# Patient Record
Sex: Female | Born: 1990 | Race: White | Hispanic: No | Marital: Single | Smoking: Never smoker
Health system: Southern US, Community
[De-identification: ages and names within clinical notes are randomized; demographics above are authoritative.]

## PROBLEM LIST (undated history)

## (undated) HISTORY — PX: TONSILLECTOMY: SUR1361

---

## 2012-05-01 ENCOUNTER — Emergency Department (HOSPITAL_COMMUNITY): Payer: Worker's Compensation

## 2012-05-01 ENCOUNTER — Encounter (HOSPITAL_COMMUNITY): Payer: Self-pay | Admitting: *Deleted

## 2012-05-01 ENCOUNTER — Emergency Department (HOSPITAL_COMMUNITY)
Admission: EM | Admit: 2012-05-01 | Discharge: 2012-05-01 | Disposition: A | Discharge status: Home / Self Care | Payer: Worker's Compensation | Attending: Emergency Medicine | Admitting: Emergency Medicine

## 2012-05-01 DIAGNOSIS — W19XXXA Unspecified fall, initial encounter: Secondary | ICD-10-CM | POA: Insufficient documentation

## 2012-05-01 DIAGNOSIS — T23029A Burn of unspecified degree of unspecified single finger (nail) except thumb, initial encounter: Secondary | ICD-10-CM | POA: Insufficient documentation

## 2012-05-01 DIAGNOSIS — T23059A Burn of unspecified degree of unspecified palm, initial encounter: Secondary | ICD-10-CM | POA: Insufficient documentation

## 2012-05-01 DIAGNOSIS — T310 Burns involving less than 10% of body surface: Secondary | ICD-10-CM | POA: Insufficient documentation

## 2012-05-01 DIAGNOSIS — Y9269 Other specified industrial and construction area as the place of occurrence of the external cause: Secondary | ICD-10-CM | POA: Insufficient documentation

## 2012-05-01 DIAGNOSIS — W860XXA Exposure to domestic wiring and appliances, initial encounter: Secondary | ICD-10-CM | POA: Insufficient documentation

## 2012-05-01 MED ORDER — TRAMADOL HCL 50 MG PO TABS: 50.0000 mg | ORAL_TABLET | Freq: Four times a day (QID) | ORAL | Status: AC | PRN

## 2012-05-01 MED ORDER — SILVER SULFADIAZINE 1 % EX CREA
TOPICAL_CREAM | Freq: Once | CUTANEOUS | Status: AC
  Administered 2012-05-01: 11:00:00 via TOPICAL
  Filled 2012-05-01: qty 50

## 2012-05-01 NOTE — ED Notes (Signed)
Pt rt index finger is swollen and black. Skin looks to be intact. Pt states she is having a burning pain and shooting pain that radiate up her arm.

## 2012-05-01 NOTE — Discharge Instructions (Signed)
Follow up with dr. Hilda Lias at [facility] tomorrow
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
## 2012-10-22 ENCOUNTER — Encounter (HOSPITAL_COMMUNITY): Payer: Self-pay

## 2012-10-22 ENCOUNTER — Emergency Department (HOSPITAL_COMMUNITY)
Admission: EM | Admit: 2012-10-22 | Discharge: 2012-10-22 | Disposition: A | Discharge status: Home / Self Care | Payer: Self-pay | Attending: Emergency Medicine | Admitting: Emergency Medicine

## 2012-10-22 ENCOUNTER — Emergency Department (HOSPITAL_COMMUNITY): Payer: Self-pay

## 2012-10-22 DIAGNOSIS — Y929 Unspecified place or not applicable: Secondary | ICD-10-CM | POA: Insufficient documentation

## 2012-10-22 DIAGNOSIS — Y9389 Activity, other specified: Secondary | ICD-10-CM | POA: Insufficient documentation

## 2012-10-22 DIAGNOSIS — S6390XA Sprain of unspecified part of unspecified wrist and hand, initial encounter: Secondary | ICD-10-CM | POA: Insufficient documentation

## 2012-10-22 DIAGNOSIS — S63601A Unspecified sprain of right thumb, initial encounter: Secondary | ICD-10-CM

## 2012-10-22 DIAGNOSIS — IMO0002 Reserved for concepts with insufficient information to code with codable children: Secondary | ICD-10-CM | POA: Insufficient documentation

## 2012-10-22 NOTE — ED Provider Notes (Signed)
History     CSN: [id number]  Arrival date & time 10/22/12  1136   First MD Initiated Contact with Patient 10/22/12 1226      Chief Complaint  Patient presents with  . Finger Injury    (Consider location/radiation/quality/duration/timing/severity/associated sxs/prior treatment) HPI Comments: Playing with her dog last PM and "jammed" her R thumb.  R hand dominant.    The history is provided by the patient. No language interpreter was used.    History reviewed. No pertinent past medical history.  Past Surgical History  Procedure Date  . Tonsillectomy     No family history on file.  History  Substance Use Topics  . Smoking status: Never Smoker   . Smokeless tobacco: Not on file  . Alcohol Use: No    OB History    Grav Para Term Preterm Abortions TAB SAB Ect Mult Living                  Review of Systems  Musculoskeletal: Positive for joint swelling.       Thumb injury   Skin: Negative for wound.  All other systems reviewed and are negative.    Allergies  Review of patient's allergies indicates no known allergies.  Home Medications   Current Outpatient Rx  Name  Route  Sig  Dispense  Refill  . IBUPROFEN 200 MG PO TABS   Oral   Take 200 mg by mouth every 6 (six) hours as needed. Pain           BP 131/71  Pulse 93  Temp 97.7 F (36.5 C) (Oral)  Resp 20  Ht 5\' 7"  (1.702 m)  Wt 220 lb (99.791 kg)  BMI 34.46 kg/m2  SpO2 99%  LMP 10/08/2012  Physical Exam  Nursing note and vitals reviewed. Constitutional: She is oriented to person, place, and time. She appears well-developed and well-nourished. No distress.  HENT:  Head: Normocephalic and atraumatic.  Eyes: EOM are normal.  Neck: Normal range of motion.  Cardiovascular: Normal rate, regular rhythm and normal heart sounds.   Pulmonary/Chest: Effort normal and breath sounds normal.  Abdominal: Soft. She exhibits no distension. There is no tenderness.  Musculoskeletal: She exhibits tenderness.        Right hand: She exhibits decreased range of motion, tenderness, bony tenderness and swelling. She exhibits normal capillary refill, no deformity and no laceration. normal sensation noted. Normal strength noted.       Hands: Neurological: She is alert and oriented to person, place, and time.  Skin: Skin is warm and dry.  Psychiatric: She has a normal mood and affect. Judgment normal.    ED Course  Procedures (including critical care time)  Labs Reviewed - No data to display Dg Finger Thumb Right  10/22/2012  *RADIOLOGY REPORT*  Clinical Data: Pain post trauma  RIGHT THUMB 2+V  Comparison: None.  Findings:  Frontal, oblique, and lateral views were obtained.  No fracture or dislocation.  Joint spaces appear intact.  No erosive change.  No radiopaque foreign body.    IMPRESSION: No abnormality noted radiographically.   Original Report Authenticated By: Bretta Bang, M.D.      1. Sprain of right thumb       MDM  No fxs  Ice, elevation, ibuprofen Thumb spica splint.   F/u with dr. Hilda Lias prn        Evalina Field, PA 10/22/12 1249

## 2012-10-22 NOTE — ED Notes (Signed)
Rt thumb injury yesterday when playing with her dog.  Swelling present.  Alert NAD

## 2012-10-22 NOTE — ED Notes (Signed)
Pt reports injuring her rt thumb while playing with her dog last night.  Pt has swelling to the area and severe pain upon movement.

## 2012-10-23 NOTE — ED Provider Notes (Signed)
Medical screening examination/treatment/procedure(s) were performed by non-physician practitioner and as supervising physician I was immediately available for consultation/collaboration.   Laray Anger, DO 10/23/12 1825

## 2013-12-02 ENCOUNTER — Emergency Department (HOSPITAL_COMMUNITY): Payer: Self-pay

## 2013-12-02 ENCOUNTER — Encounter (HOSPITAL_COMMUNITY): Payer: Self-pay | Admitting: Emergency Medicine

## 2013-12-02 ENCOUNTER — Emergency Department (HOSPITAL_COMMUNITY)
Admission: EM | Admit: 2013-12-02 | Discharge: 2013-12-02 | Disposition: A | Discharge status: Home / Self Care | Payer: Self-pay | Attending: Emergency Medicine | Admitting: Emergency Medicine

## 2013-12-02 DIAGNOSIS — K2900 Acute gastritis without bleeding: Secondary | ICD-10-CM | POA: Insufficient documentation

## 2013-12-02 DIAGNOSIS — R002 Palpitations: Secondary | ICD-10-CM | POA: Insufficient documentation

## 2013-12-02 DIAGNOSIS — R1013 Epigastric pain: Secondary | ICD-10-CM

## 2013-12-02 LAB — CBC WITH DIFFERENTIAL/PLATELET
BASOS ABS: 0 10*3/uL (ref 0.0–0.1)
Basophils Relative: 0 % (ref 0–1)
EOS PCT: 3 % (ref 0–5)
Eosinophils Absolute: 0.3 10*3/uL (ref 0.0–0.7)
HEMATOCRIT: 39 % (ref 36.0–46.0)
HEMOGLOBIN: 13.5 g/dL (ref 12.0–15.0)
LYMPHS ABS: 1.9 10*3/uL (ref 0.7–4.0)
LYMPHS PCT: 20 % (ref 12–46)
MCH: 29.7 pg (ref 26.0–34.0)
MCHC: 34.6 g/dL (ref 30.0–36.0)
MCV: 85.9 fL (ref 78.0–100.0)
MONO ABS: 0.7 10*3/uL (ref 0.1–1.0)
MONOS PCT: 8 % (ref 3–12)
NEUTROS ABS: 6.3 10*3/uL (ref 1.7–7.7)
Neutrophils Relative %: 69 % (ref 43–77)
Platelets: 349 10*3/uL (ref 150–400)
RBC: 4.54 MIL/uL (ref 3.87–5.11)
RDW: 12.9 % (ref 11.5–15.5)
WBC: 9.2 10*3/uL (ref 4.0–10.5)

## 2013-12-02 LAB — URINALYSIS, ROUTINE W REFLEX MICROSCOPIC
Bilirubin Urine: NEGATIVE
GLUCOSE, UA: NEGATIVE mg/dL
Hgb urine dipstick: NEGATIVE
KETONES UR: NEGATIVE mg/dL
Nitrite: NEGATIVE
PROTEIN: NEGATIVE mg/dL
Specific Gravity, Urine: 1.02 (ref 1.005–1.030)
UROBILINOGEN UA: 0.2 mg/dL (ref 0.0–1.0)
pH: 6 (ref 5.0–8.0)

## 2013-12-02 LAB — COMPREHENSIVE METABOLIC PANEL
ALT: 19 U/L (ref 0–35)
AST: 13 U/L (ref 0–37)
Albumin: 3.9 g/dL (ref 3.5–5.2)
Alkaline Phosphatase: 82 U/L (ref 39–117)
BILIRUBIN TOTAL: 0.5 mg/dL (ref 0.3–1.2)
BUN: 11 mg/dL (ref 6–23)
CHLORIDE: 102 meq/L (ref 96–112)
CO2: 25 meq/L (ref 19–32)
CREATININE: 0.71 mg/dL (ref 0.50–1.10)
Calcium: 9.4 mg/dL (ref 8.4–10.5)
GLUCOSE: 97 mg/dL (ref 70–99)
Potassium: 3.8 mEq/L (ref 3.7–5.3)
Sodium: 139 mEq/L (ref 137–147)
Total Protein: 7.6 g/dL (ref 6.0–8.3)

## 2013-12-02 LAB — LIPASE, BLOOD: Lipase: 23 U/L (ref 11–59)

## 2013-12-02 LAB — URINE MICROSCOPIC-ADD ON

## 2013-12-02 MED ORDER — ONDANSETRON HCL 4 MG PO TABS: 4.0000 mg | ORAL_TABLET | Freq: Four times a day (QID) | ORAL | Status: AC

## 2013-12-02 MED ORDER — DIPHENHYDRAMINE HCL 50 MG/ML IJ SOLN
25.0000 mg | Freq: Once | INTRAMUSCULAR | Status: AC
  Administered 2013-12-02: 25 mg via INTRAVENOUS
  Filled 2013-12-02: qty 1

## 2013-12-02 MED ORDER — SODIUM CHLORIDE 0.9 % IV SOLN: 1000.0000 mL | INTRAVENOUS | Status: DC

## 2013-12-02 MED ORDER — SODIUM CHLORIDE 0.9 % IV SOLN
1000.0000 mL | Freq: Once | INTRAVENOUS | Status: AC
  Administered 2013-12-02: 1000 mL via INTRAVENOUS

## 2013-12-02 MED ORDER — METOCLOPRAMIDE HCL 5 MG/ML IJ SOLN
10.0000 mg | Freq: Once | INTRAMUSCULAR | Status: AC
  Administered 2013-12-02: 10 mg via INTRAVENOUS
  Filled 2013-12-02: qty 2

## 2013-12-02 MED ORDER — FAMOTIDINE IN NACL 20-0.9 MG/50ML-% IV SOLN
20.0000 mg | Freq: Once | INTRAVENOUS | Status: AC
  Administered 2013-12-02: 20 mg via INTRAVENOUS
  Filled 2013-12-02: qty 50

## 2013-12-02 MED ORDER — FENTANYL CITRATE 0.05 MG/ML IJ SOLN
50.0000 ug | INTRAMUSCULAR | Status: DC | PRN
  Administered 2013-12-02: 50 ug via INTRAVENOUS
  Filled 2013-12-02 (×2): qty 2

## 2013-12-02 MED ORDER — OMEPRAZOLE 20 MG PO CPDR: DELAYED_RELEASE_CAPSULE | ORAL | Status: AC

## 2013-12-02 NOTE — ED Notes (Signed)
Ultrasound being done at bedside.

## 2013-12-02 NOTE — Discharge Instructions (Signed)
Avoid fried, spicy or greasy foods. Avoid alcohol, aspirin products, ibuprofen, motrin, aleve, advil, alka seltzer, all of which can make your abdominal pain worse. Take the prilosec OTC as described. Use the zofran for nausea or vomiting. You can be rechecked by Dr Jena Gaussourk, the Gastroenterologist on call if your symptoms persist. Call his office to get an appointment.     Gastritis, Adult Gastritis is soreness and swelling (inflammation) of the lining of the stomach. Gastritis can develop as a sudden onset (acute) or long-term (chronic) condition. If gastritis is not treated, it can lead to stomach bleeding and ulcers. CAUSES  Gastritis occurs when the stomach lining is weak or damaged. Digestive juices from the stomach then inflame the weakened stomach lining. The stomach lining may be weak or damaged due to viral or bacterial infections. One common bacterial infection is the Helicobacter pylori infection. Gastritis can also result from excessive alcohol consumption, taking certain medicines, or having too much acid in the stomach.  SYMPTOMS  In some cases, there are no symptoms. When symptoms are present, they may include:  Pain or a burning sensation in the upper abdomen.  Nausea.  Vomiting.  An uncomfortable feeling of fullness after eating. DIAGNOSIS  Your caregiver may suspect you have gastritis based on your symptoms and a physical exam. To determine the cause of your gastritis, your caregiver may perform the following:  Blood or stool tests to check for the H pylori bacterium.  Gastroscopy. A thin, flexible tube (endoscope) is passed down the esophagus and into the stomach. The endoscope has a light and camera on the end. Your caregiver uses the endoscope to view the inside of the stomach.  Taking a tissue sample (biopsy) from the stomach to examine under a microscope. TREATMENT  Depending on the cause of your gastritis, medicines may be prescribed. If you have a bacterial  infection, such as an H pylori infection, antibiotics may be given. If your gastritis is caused by too much acid in the stomach, H2 blockers or antacids may be given. Your caregiver may recommend that you stop taking aspirin, ibuprofen, or other nonsteroidal anti-inflammatory drugs (NSAIDs). HOME CARE INSTRUCTIONS  Only take over-the-counter or prescription medicines as directed by your caregiver.  If you were given antibiotic medicines, take them as directed. Finish them even if you start to feel better.  Drink enough fluids to keep your urine clear or pale yellow.  Avoid foods and drinks that make your symptoms worse, such as:  Caffeine or alcoholic drinks.  Chocolate.  Peppermint or mint flavorings.  Garlic and onions.  Spicy foods.  Citrus fruits, such as oranges, lemons, or limes.  Tomato-based foods such as sauce, chili, salsa, and pizza.  Fried and fatty foods.  Eat small, frequent meals instead of large meals. SEEK IMMEDIATE MEDICAL CARE IF:   You have black or dark red stools.  You vomit blood or material that looks like coffee grounds.  You are unable to keep fluids down.  Your abdominal pain gets worse.  You have a fever.  You do not feel better after 1 week.  You have any other questions or concerns. MAKE SURE YOU:  Understand these instructions.  Will watch your condition.  Will get help right away if you are not doing well or get worse. Document Released: 10/18/2001 Document Revised: 04/24/2012 Document Reviewed: 12/07/2011 [facility] LPExit[facility] Patient Information 2014 [place], [place].

## 2013-12-02 NOTE — ED Provider Notes (Signed)
CSN: [id number]     Arrival date & time 12/02/13  4098 History  This chart was scribed for Ward Givens, MD by Dorothey Baseman, ED Scribe. This patient was seen in room APA19/APA19 and the patient's care was started at 7:37 AM.    Chief Complaint  Patient presents with  . Abdominal Pain   The history is provided by the patient. No language interpreter was used.   HPI Comments: Yvonne Haley is a 23 y.o. female who presents to the Emergency Department complaining of a constant, sharp pain to the epigastric region of the abdomen onset 3 days ago that began progressively worsening last night, around 8.5 hours ago. She states that last night was the last time she had anything to eat which was pizza about 7:30 pm and had a few sips of soda this morning. She states that the pain will intermittently shoot into her mid-back, more around the left scapula. She states that the pain is exacerbated with movement and eating. She states that the pain is alleviated with laying back. Patient reports associated abdominal bloating, nausea, multiple episodes (approximately 5-6 last night and again this morning) of emesis, and 1 episode of diarrhea. Patient reports some ntermittent palpitations, described as a "fluttering" or "skipping a beat" that she expresses may be related to stress. She denies fever, dysuria, urinary frequency. She denies burning pain or discomfort. She c/o bloating. She denies history of abdominal surgeries. She reports a familial history (mother) of cholelithiasis. Patient has no other pertinent medical history. Patient does not drink or smoke. Patient states she does not currently have a PCP.   PCP none  History reviewed. No pertinent past medical history. Past Surgical History  Procedure Laterality Date  . Tonsillectomy     No family history on file. History  Substance Use Topics  . Smoking status: Never Smoker   . Smokeless tobacco: Not on file  . Alcohol Use: No   Employed at  [employer]   OB History   Grav Para Term Preterm Abortions TAB SAB Ect Mult Living                 Review of Systems  Constitutional: Negative for fever.  Cardiovascular: Positive for palpitations.  Gastrointestinal: Positive for nausea, vomiting, abdominal pain and diarrhea. Negative for abdominal distention.  Genitourinary: Negative for dysuria and urgency.    Allergies  Review of patient's allergies indicates no known allergies.  Home Medications   Current Outpatient Rx  Name  Route  Sig  Dispense  Refill  . ibuprofen (ADVIL,MOTRIN) 200 MG tablet   Oral   Take 200 mg by mouth every 6 (six) hours as needed. Pain          Triage Vitals: BP 142/68  Pulse 79  Temp(Src) 97.6 F (36.4 C)  Resp 18  Ht 5\' 7"  (1.702 m)  Wt 245 lb (111.131 kg)  BMI 38.36 kg/m2  SpO2 100%  LMP 11/04/2013  Vital signs normal    Physical Exam  Nursing note and vitals reviewed. Constitutional: She is oriented to person, place, and time. She appears well-developed and well-nourished.  Non-toxic appearance. She does not appear ill. No distress.  HENT:  Head: Normocephalic and atraumatic.  Right Ear: External ear normal.  Left Ear: External ear normal.  Nose: Nose normal. No mucosal edema or rhinorrhea.  Mouth/Throat: Mucous membranes are normal. No dental abscesses or uvula swelling. No oropharyngeal exudate.  Mucus membranes somewhat dry.   Eyes: Conjunctivae and EOM  are normal. Pupils are equal, round, and reactive to light.  Neck: Normal range of motion and full passive range of motion without pain. Neck supple.  Cardiovascular: Normal rate, regular rhythm and normal heart sounds.  Exam reveals no gallop and no friction rub.   No murmur heard. Pulmonary/Chest: Effort normal and breath sounds normal. No respiratory distress. She has no wheezes. She has no rhonchi. She has no rales. She exhibits no tenderness and no crepitus.  Abdominal: Soft. Normal appearance. She exhibits no distension.  There is tenderness. There is no rebound and no guarding.    Diminished bowel sounds. Most tenderness to palpation to the epigastric region, but diffusely in the upper abdomen.   Musculoskeletal: Normal range of motion. She exhibits no edema and no tenderness.  Moves all extremities well.   Neurological: She is alert and oriented to person, place, and time. She has normal strength. No cranial nerve deficit.  Skin: Skin is warm, dry and intact. No rash noted. No erythema. No pallor.  Psychiatric: She has a normal mood and affect. Her speech is normal and behavior is normal. Her mood appears not anxious.    ED Course  Procedures (including critical care time)  Medications  0.9 %  sodium chloride infusion (0 mLs Intravenous Stopped 12/02/13 0919)    Followed by  0.9 %  sodium chloride infusion (1,000 mLs Intravenous New Bag/Given 12/02/13 0920)    Followed by  0.9 %  sodium chloride infusion (not administered)  fentaNYL (SUBLIMAZE) injection 50 mcg (50 mcg Intravenous Given 12/02/13 0947)  metoCLOPramide (REGLAN) injection 10 mg (10 mg Intravenous Given 12/02/13 0804)  diphenhydrAMINE (BENADRYL) injection 25 mg (25 mg Intravenous Given 12/02/13 0804)  famotidine (PEPCID) IVPB 20 mg (0 mg Intravenous Stopped 12/02/13 1021)   DIAGNOSTIC STUDIES: Oxygen Saturation is 100% on room air, normal by my interpretation.    COORDINATION OF CARE: 7:44 AM- Discussed that symptoms are likely related to her gallbladder. Will order blood labs and UA. Will order IV fluids, Reglan, Benadryl, and Sublimaze to manage symptoms. Discussed treatment plan with patient at bedside and patient verbalized agreement.   8:33 AM- Patient reports feeling much better after receiving the medications and states that her pain is almost gone. Discussed that lab results were normal. Will order an ultrasound to rule out cholelithiasis. Discussed treatment plan with patient at bedside and patient verbalized agreement.   9:48 AM-  Discussed that ultrasound results were negative for cholelithiasis. Will discharge patient with Pepcid. Discussed treatment plan with patient at bedside and patient verbalized agreement.   10:50 AM- Patient reports feeling much better. Will order a GI cocktail. Advised patient to follow up with the referred GI specialist as needed. Will discharge patient with Prilosec and Zofran. Discussed treatment plan with patient at bedside and patient verbalized agreement.    Results for orders placed during the hospital encounter of 12/02/13  CBC WITH DIFFERENTIAL      Result Value Range   WBC 9.2  4.0 - 10.5 K/uL   RBC 4.54  3.87 - 5.11 MIL/uL   Hemoglobin 13.5  12.0 - 15.0 g/dL   HCT 16.1  09.6 - 04.5 %   MCV 85.9  78.0 - 100.0 fL   MCH 29.7  26.0 - 34.0 pg   MCHC 34.6  30.0 - 36.0 g/dL   RDW 40.9  81.1 - 91.4 %   Platelets 349  150 - 400 K/uL   Neutrophils Relative % 69  43 - 77 %  Neutro Abs 6.3  1.7 - 7.7 K/uL   Lymphocytes Relative 20  12 - 46 %   Lymphs Abs 1.9  0.7 - 4.0 K/uL   Monocytes Relative 8  3 - 12 %   Monocytes Absolute 0.7  0.1 - 1.0 K/uL   Eosinophils Relative 3  0 - 5 %   Eosinophils Absolute 0.3  0.0 - 0.7 K/uL   Basophils Relative 0  0 - 1 %   Basophils Absolute 0.0  0.0 - 0.1 K/uL  COMPREHENSIVE METABOLIC PANEL      Result Value Range   Sodium 139  137 - 147 mEq/L   Potassium 3.8  3.7 - 5.3 mEq/L   Chloride 102  96 - 112 mEq/L   CO2 25  19 - 32 mEq/L   Glucose, Bld 97  70 - 99 mg/dL   BUN 11  6 - 23 mg/dL   Creatinine, Ser 1.61  0.50 - 1.10 mg/dL   Calcium 9.4  8.4 - 09.6 mg/dL   Total Protein 7.6  6.0 - 8.3 g/dL   Albumin 3.9  3.5 - 5.2 g/dL   AST 13  0 - 37 U/L   ALT 19  0 - 35 U/L   Alkaline Phosphatase 82  39 - 117 U/L   Total Bilirubin 0.5  0.3 - 1.2 mg/dL   GFR calc non Af Amer >90  >90 mL/min   GFR calc Af Amer >90  >90 mL/min  LIPASE, BLOOD      Result Value Range   Lipase 23  11 - 59 U/L  URINALYSIS, ROUTINE W REFLEX MICROSCOPIC      Result Value  Range   Color, Urine YELLOW  YELLOW   APPearance CLEAR  CLEAR   Specific Gravity, Urine 1.020  1.005 - 1.030   pH 6.0  5.0 - 8.0   Glucose, UA NEGATIVE  NEGATIVE mg/dL   Hgb urine dipstick NEGATIVE  NEGATIVE   Bilirubin Urine NEGATIVE  NEGATIVE   Ketones, ur NEGATIVE  NEGATIVE mg/dL   Protein, ur NEGATIVE  NEGATIVE mg/dL   Urobilinogen, UA 0.2  0.0 - 1.0 mg/dL   Nitrite NEGATIVE  NEGATIVE   Leukocytes, UA SMALL (*) NEGATIVE  URINE MICROSCOPIC-ADD ON      Result Value Range   Squamous Epithelial / LPF FEW (*) RARE   WBC, UA 3-6  <3 WBC/hpf   Bacteria, UA FEW (*) RARE   Laboratory interpretation all normal   US Abdomen Limited Ruq  12/02/2013   CLINICAL DATA:  Postprandial abdominal pain  EXAM: US ABDOMEN LIMITED - RIGHT UPPER QUADRANT  COMPARISON:  None.  FINDINGS: Gallbladder:  No gallstones, gallbladder wall thickening, or pericholecystic fluid. Negative sonographic Murphy's sign.  Common bile duct:  Diameter: 4 mm.  Liver:  At the upper limits of normal for parenchymal echogenicity. No focal hepatic lesion is seen.  IMPRESSION: Negative right upper quadrant ultrasound.   Electronically Signed   By: Charline Bills M.D.   On: 12/02/2013 09:15     EKG Interpretation   None       MDM   1. Epigastric abdominal pain   2. Gastritis, acute    Discharge Medication List as of 12/02/2013 11:04 AM    START taking these medications   Details  omeprazole (PRILOSEC) 20 MG capsule Take 1 po BID x 2 weeks then once a day, Print    ondansetron (ZOFRAN) 4 MG tablet Take 1 tablet (4 mg total) by mouth every 6 (  six) hours., Starting 12/02/2013, Until Discontinued, Print        Plan discharge   Devoria AlbeIva Ahniya Mitchum, MD, FACEP   I personally performed the services described in this documentation, which was scribed in my presence. The recorded information has been reviewed and considered.  Devoria AlbeIva Bert Ptacek, MD, Armando GangFACEP     Ward GivensIva L Laquita Harlan, MD 12/02/13 [phone]

## 2013-12-02 NOTE — ED Notes (Signed)
Epigastric pain x3 days. Symptoms worsens last night with vomiting.

## 2013-12-02 NOTE — ED Notes (Signed)
Pt states her pain is 9/10 but states she does not want any pain medication now

## 2013-12-03 LAB — URINE CULTURE
COLONY COUNT: NO GROWTH
Culture: NO GROWTH

## 2016-01-18 IMAGING — US US ABDOMEN LIMITED
1 series · 14 of 25 positions shown · non-contrast
Comparison: None.

CLINICAL DATA: Postprandial abdominal pain

EXAM:
US ABDOMEN LIMITED - RIGHT UPPER QUADRANT

[Series 1: us abdomen limited · 0.26mm/px · 14 of 45 slices shown]
[im 1/45]
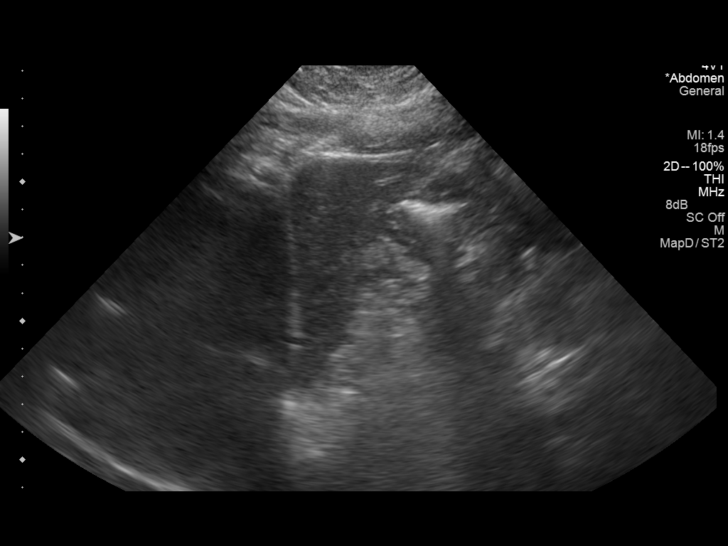
[im 4/45]
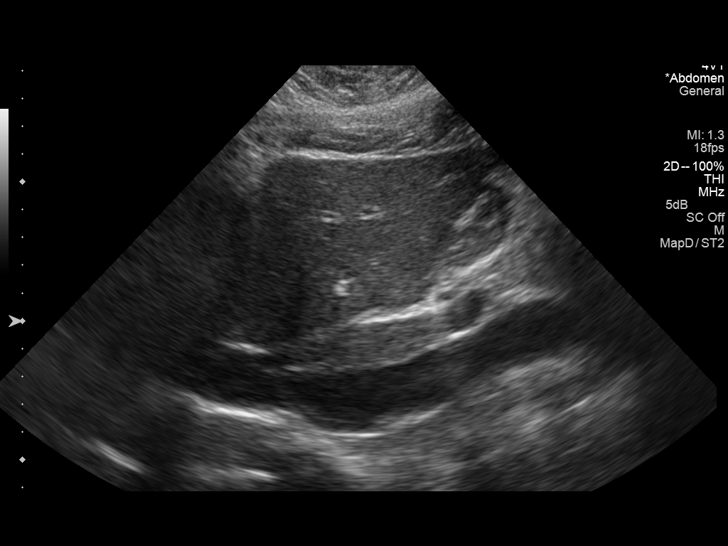
[im 8/45]
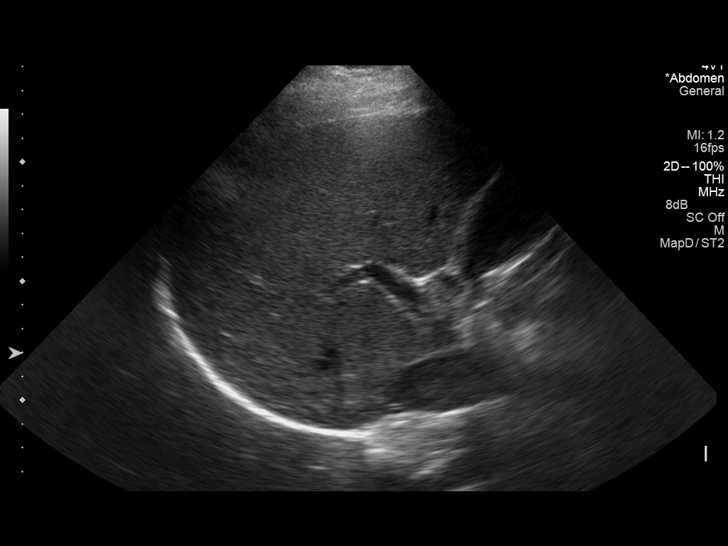
[im 12/45]
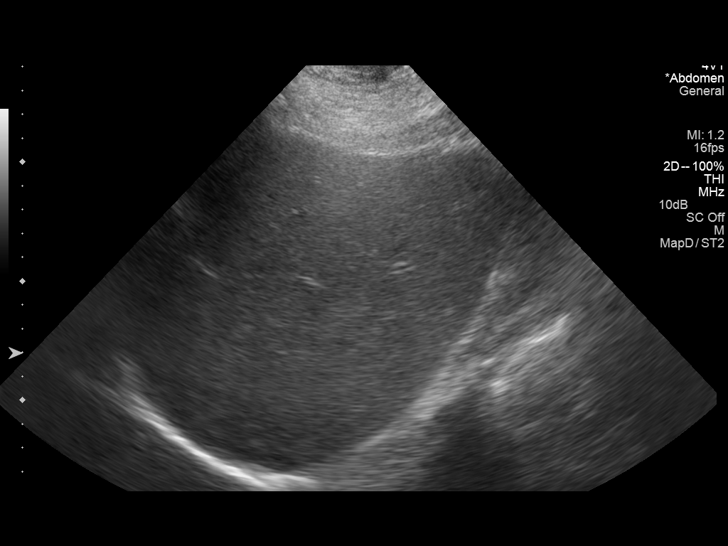
[im 15/45]
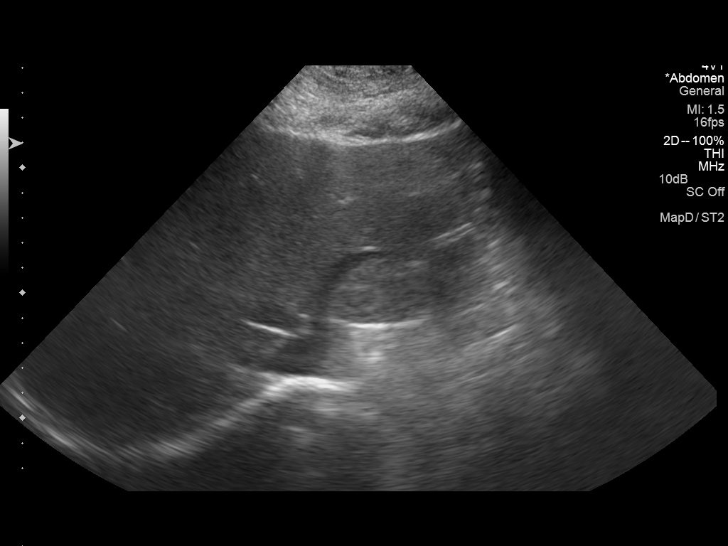
[im 17/45]
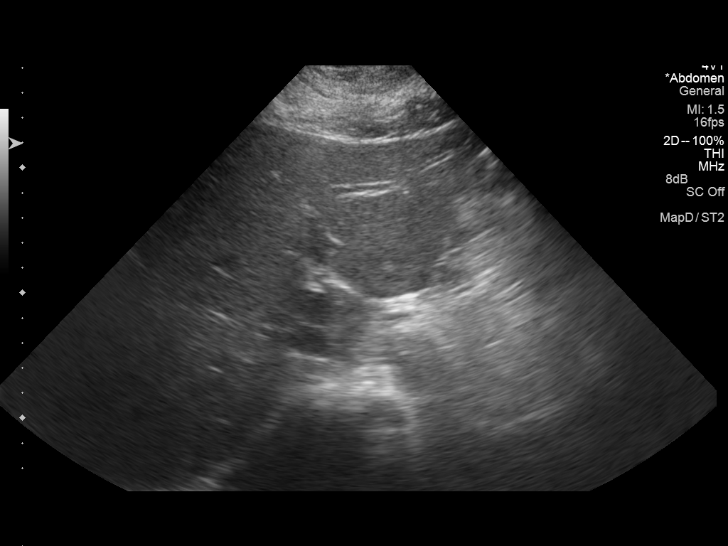
[im 21/45]
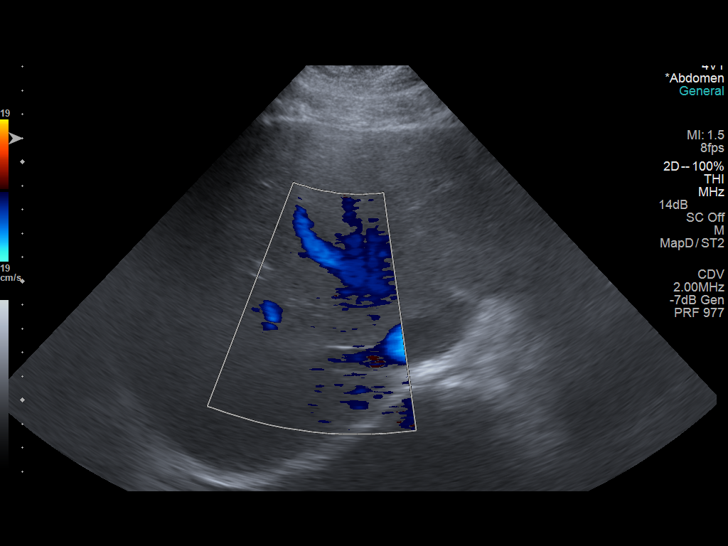
[im 24/45]
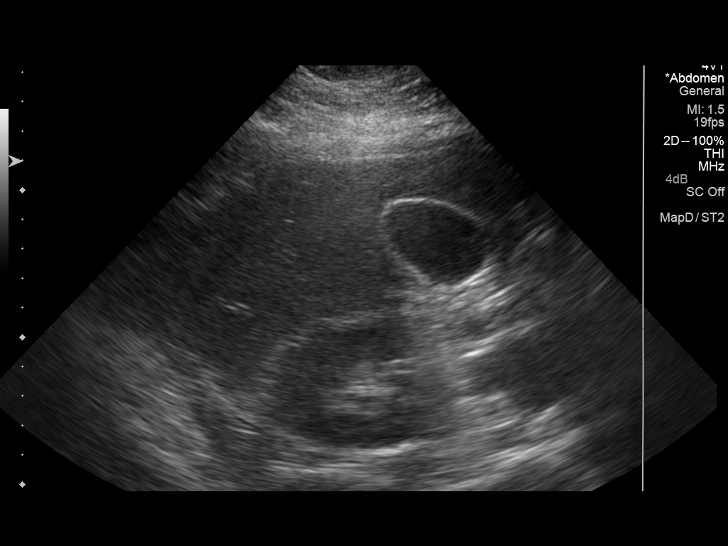
[im 28/45]
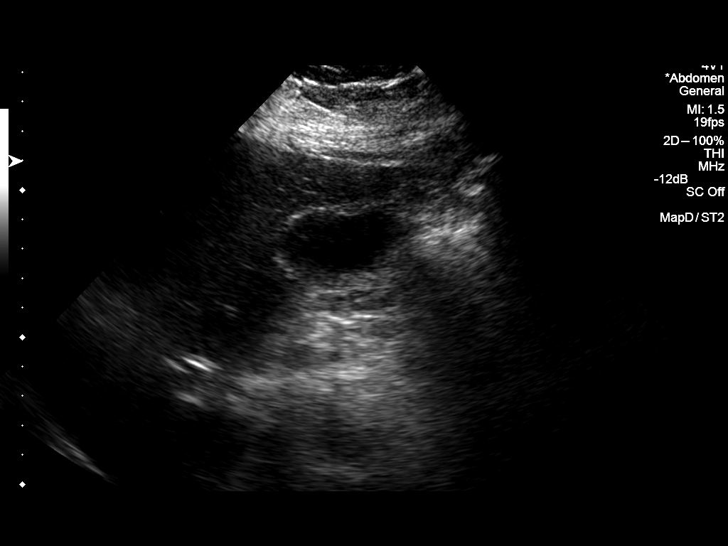
[im 30/45]
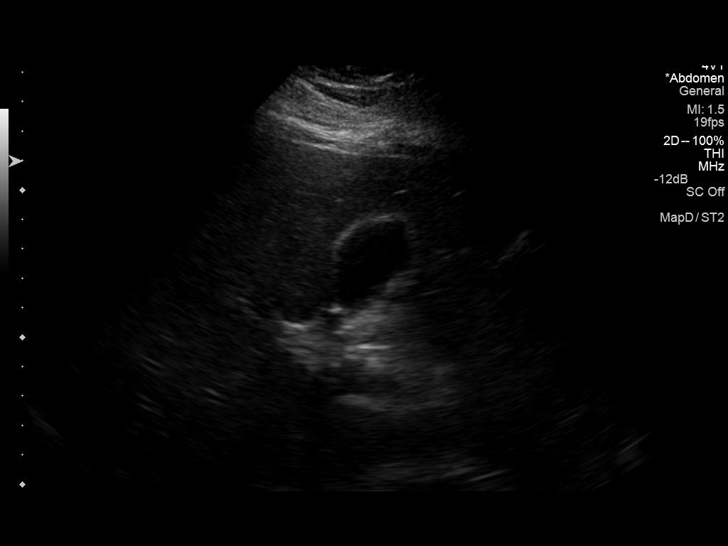
[im 34/45]
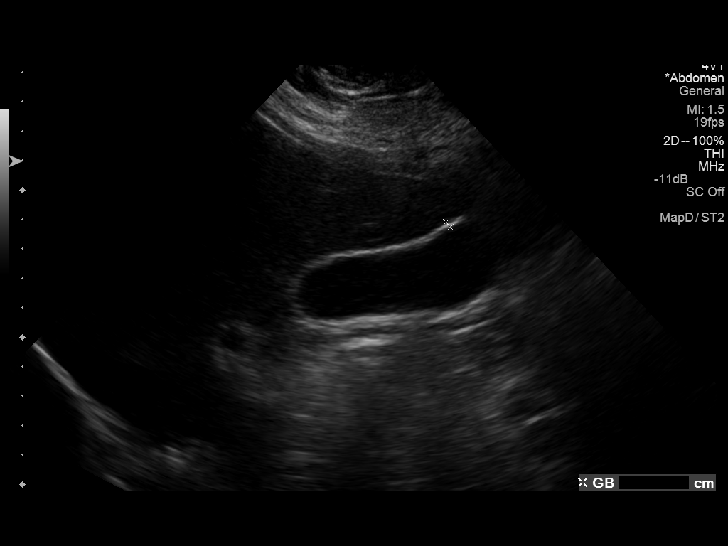
[im 37/45]
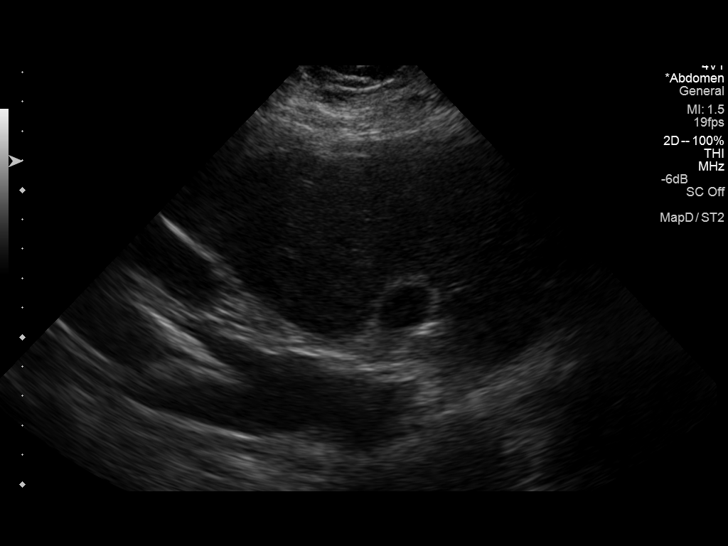
[im 41/45]
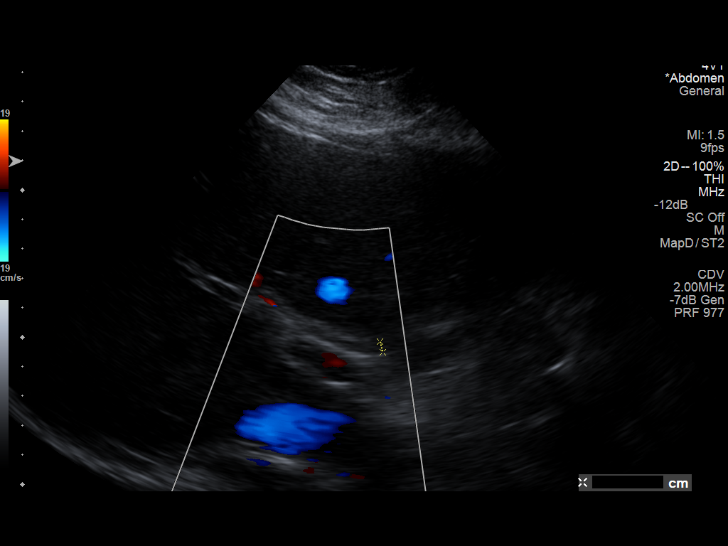
[im 45/45]
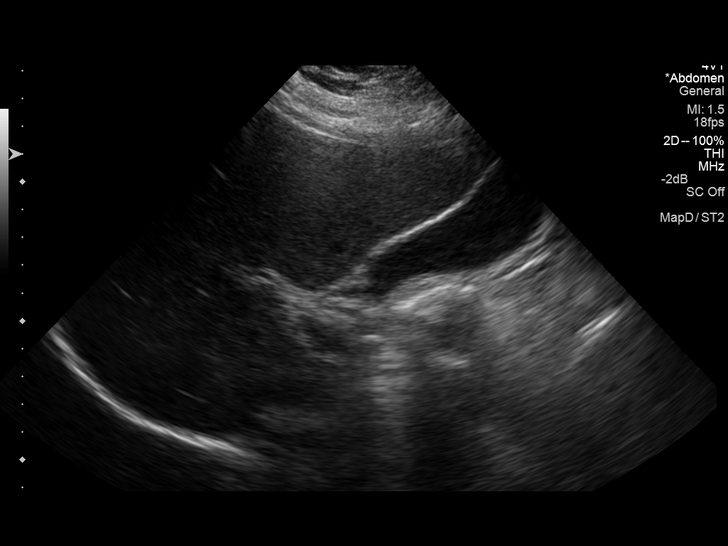

[14 of 25 positions shown; findings below may reference images not displayed]

FINDINGS: Gallbladder:

No gallstones, gallbladder wall thickening, or pericholecystic
fluid. Negative sonographic Murphy's sign.

Common bile duct:

Diameter: 4 mm.

Liver:

At the upper limits of normal for parenchymal echogenicity. No focal
hepatic lesion is seen.
IMPRESSION: Negative right upper quadrant ultrasound.
